# Patient Record
Sex: Female | Born: 1970 | Race: White | Hispanic: No | Marital: Married | Smoking: Never smoker
Health system: Southern US, Community
[De-identification: ages and names within clinical notes are randomized; demographics above are authoritative.]

## PROBLEM LIST (undated history)

## (undated) DIAGNOSIS — R011 Cardiac murmur, unspecified: Secondary | ICD-10-CM

## (undated) DIAGNOSIS — F32A Depression, unspecified: Secondary | ICD-10-CM

## (undated) DIAGNOSIS — C801 Malignant (primary) neoplasm, unspecified: Secondary | ICD-10-CM

## (undated) DIAGNOSIS — F329 Major depressive disorder, single episode, unspecified: Secondary | ICD-10-CM

## (undated) DIAGNOSIS — E079 Disorder of thyroid, unspecified: Secondary | ICD-10-CM

## (undated) DIAGNOSIS — M1990 Unspecified osteoarthritis, unspecified site: Secondary | ICD-10-CM

## (undated) HISTORY — DX: Disorder of thyroid, unspecified: E07.9

## (undated) HISTORY — DX: Depression, unspecified: F32.A

## (undated) HISTORY — DX: Unspecified osteoarthritis, unspecified site: M19.90

## (undated) HISTORY — DX: Cardiac murmur, unspecified: R01.1

## (undated) HISTORY — DX: Malignant (primary) neoplasm, unspecified: C80.1

## (undated) HISTORY — PX: TONSILLECTOMY AND ADENOIDECTOMY: SHX28

## (undated) HISTORY — DX: Major depressive disorder, single episode, unspecified: F32.9

---
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
## 2012-03-05 ENCOUNTER — Telehealth: Payer: Self-pay | Admitting: Endocrinology

## 2012-03-05 NOTE — Telephone Encounter (Signed)
    [phone] 

## 2012-03-06 ENCOUNTER — Telehealth: Payer: Self-pay | Admitting: Endocrinology

## 2012-03-06 NOTE — Telephone Encounter (Signed)
Please note policy

## 2012-03-06 NOTE — Telephone Encounter (Signed)
The patient called and is hoping to be a new pt of Dr.Ellison to follow her diabetes.  She does not have a PCP and does not have a referral.  I explained the office policy, however, she is in hopes an exception can be made.  Please let me know how to respond to her request.    Thanks!

## 2012-05-19 ENCOUNTER — Encounter: Payer: Self-pay | Admitting: Endocrinology

## 2012-05-19 ENCOUNTER — Ambulatory Visit (INDEPENDENT_AMBULATORY_CARE_PROVIDER_SITE_OTHER): Payer: PRIVATE HEALTH INSURANCE | Admitting: Endocrinology

## 2012-05-19 ENCOUNTER — Telehealth: Payer: Self-pay | Admitting: *Deleted

## 2012-05-19 VITALS — BP 138/72 | HR 72 | Temp 98.5°F | Wt 175.0 lb

## 2012-05-19 DIAGNOSIS — E109 Type 1 diabetes mellitus without complications: Secondary | ICD-10-CM

## 2012-05-19 DIAGNOSIS — E039 Hypothyroidism, unspecified: Secondary | ICD-10-CM

## 2012-05-19 DIAGNOSIS — E041 Nontoxic single thyroid nodule: Secondary | ICD-10-CM

## 2012-05-19 DIAGNOSIS — M069 Rheumatoid arthritis, unspecified: Secondary | ICD-10-CM

## 2012-05-19 NOTE — Telephone Encounter (Signed)
AUTHORIZATION FOR RELEASE OF INFORMATION SENT TO DR. MICHAEL POLISKY OFFICE AND TO DR. Mosie Lukes OFFICE

## 2012-05-19 NOTE — Progress Notes (Signed)
Subjective:    Patient ID: Kimberly Finley, female    DOB: Nov 18, 1970, 41 y.o.   MRN: [id number]  HPI pt states 29 years h/o dm.  she is unaware of any chronic complications.  she has been on insulin since dx.  pt says her diet is good, but and exercise is not.   She has an animas insulin pump, but does not want a continuous glucose monitor.  She averages a total of approx 35 units per day, sice she has been on the prednisone.  no cbg record, but states cbg's vary from 200-300's.  There is no trend throughout the day.   She reports a few mos of moderate arthralgias, worst at the hands, but no assoc numbness. Past Medical History  Diagnosis Date  . Arthritis   . Cancer   . Depression   . Thyroid disease   . Heart murmur     Past Surgical History  Procedure Date  . Tonsillectomy and adenoidectomy   . Breast lumpectomy 1976    History   Social History  . Marital Status: Married    Spouse Name: N/A    Number of Children: N/A  . Years of Education: N/A   Occupational History  . Not on file.   Social History Main Topics  . Smoking status: Never Smoker   . Smokeless tobacco: Not on file  . Alcohol Use: Yes  . Drug Use:   . Sexually Active:    Other Topics Concern  . Not on file   Social History Narrative  . No narrative on file    Current Outpatient Prescriptions on File Prior to Visit  Medication Sig Dispense Refill  . Insulin Lispro, Human, (HUMALOG Jerome) Inject into the skin.      Marland Kitchen levothyroxine (SYNTHROID, LEVOTHROID) 150 MCG tablet Take 150 mcg by mouth daily.        Not on File  Family History  Problem Relation Age of Onset  . Diabetes Father   . Cancer Maternal Grandmother   . Stroke Maternal Grandmother   . Hypertension Maternal Grandmother   . Heart disease Maternal Grandmother   . Stroke Maternal Grandfather   . Hypertension Maternal Grandfather   . Heart disease Maternal Grandfather     BP 138/72  Pulse 72  Temp 98.5 F (36.9 C) (Oral)  Wt 175  lb (79.379 kg)  SpO2 99%    Review of Systems denies blurry vision, headache, chest pain, sob, n/v, urinary frequency, cramps, excessive diaphoresis, memory loss, depression, rhinorrhea, and easy bruising.  She has a few lbs of weight gain. She has reg menses.    Objective:   Physical Exam VS: see vs page GEN: no distress HEAD: head: no deformity eyes: no periorbital swelling, no proptosis external nose and ears are normal mouth: no lesion seen NECK: supple, thyroid is not enlarged, but there is a 2-3 cm right thyroid nodule CHEST WALL: no deformity LUNGS:  Clear to auscultation CV: reg rate and rhythm, no murmur ABD: abdomen is soft, nontender.  no hepatosplenomegaly.  not distended.  no hernia.   MUSCULOSKELETAL: muscle bulk and strength are grossly normal.  no obvious joint swelling.  gait is normal and steady EXTEMITIES: no deformity.  no ulcer on the feet.  feet are of normal color and temp.  no edema PULSES: dorsalis pedis intact bilat.  no carotid bruit NEURO:  cn 2-12 grossly intact.   readily moves all 4's.  sensation is intact to touch on the feet SKIN:  Normal texture and temperature.  No rash or suspicious lesion is visible.   NODES:  None palpable at the neck PSYCH: alert, oriented x3.  Does not appear anxious nor depressed.     Assessment & Plan:  DM. Based on the pattern of her cbg's, she needs some adjustment in her therapy Arthralgias.  Prednisone can exac glycemic control, but we'll work around that. Nodular goiter, apparently benign

## 2012-05-19 NOTE — Patient Instructions (Addendum)
good diet and exercise habits significanly improve the control of your diabetes.  please let me know if you wish to be referred to a dietician.  high blood sugar is very risky to your health.  you should see an eye doctor every year.  You are at higher than average risk for pneumonia and hepatitis-B.  You should be vaccinated against both.   controlling your blood pressure and cholesterol drastically reduces the damage diabetes does to your body.  this also applies to quitting smoking.  please discuss these with your doctor.  you should take an aspirin every day, unless you have been advised by a doctor not to. check your blood sugar 4 times a day--before the 3 meals, and at bedtime.  also check if you have symptoms of your blood sugar being too high or too low.  please keep a record of the readings and bring it to your next appointment here.  please call us sooner if your blood sugar goes below 70, or if you have a lot of readings over 200.  Please let me know if you want to pursue a continuous glucose monitor.   In view of your medical condition, you should avoid pregnancy until we have decided it is safe.   Please sigh release of information from drs truslow and polisky. Please continue basal rate of 0.8 unit/hr, except for units/hr.   Please increase mealtime bolus to 1 unit/9 grams carbohydrate.  continue the same correction bolus (which some people call "sensitivity," or "insulin sensitivity ratio," or just "isr").   Please let me know your pump settings when you return.  Please come back for a follow-up appointment in 3 weeks.   Cc dr Kellie Simmering.

## 2012-05-20 ENCOUNTER — Telehealth: Payer: Self-pay | Admitting: *Deleted

## 2012-05-20 NOTE — Telephone Encounter (Signed)
OFFICE NOTES FOR PATIENT FROM DR. Bryan Lemma OFFICE RECEIVED AND GIVEN TO DR. ELLISON.

## 2012-06-09 ENCOUNTER — Ambulatory Visit: Payer: PRIVATE HEALTH INSURANCE | Admitting: Endocrinology

## 2012-10-22 ENCOUNTER — Other Ambulatory Visit: Payer: Self-pay | Admitting: Endocrinology

## 2012-10-22 DIAGNOSIS — E041 Nontoxic single thyroid nodule: Secondary | ICD-10-CM

## 2012-11-16 ENCOUNTER — Other Ambulatory Visit: Payer: PRIVATE HEALTH INSURANCE

## 2013-03-18 ENCOUNTER — Other Ambulatory Visit (HOSPITAL_COMMUNITY): Payer: Self-pay | Admitting: Urology

## 2013-03-18 DIAGNOSIS — N3020 Other chronic cystitis without hematuria: Secondary | ICD-10-CM

## 2013-03-23 ENCOUNTER — Ambulatory Visit (HOSPITAL_COMMUNITY)
Admission: RE | Admit: 2013-03-23 | Discharge: 2013-03-23 | Disposition: A | Discharge status: Home / Self Care | Payer: BC Managed Care – PPO | Source: Ambulatory Visit | Attending: Urology | Admitting: Urology

## 2013-03-23 DIAGNOSIS — N3020 Other chronic cystitis without hematuria: Secondary | ICD-10-CM | POA: Insufficient documentation

## 2013-05-05 ENCOUNTER — Other Ambulatory Visit: Payer: Self-pay | Admitting: Endocrinology

## 2013-05-05 ENCOUNTER — Other Ambulatory Visit (HOSPITAL_COMMUNITY): Payer: Self-pay | Admitting: Internal Medicine

## 2013-05-05 DIAGNOSIS — Z1231 Encounter for screening mammogram for malignant neoplasm of breast: Secondary | ICD-10-CM

## 2013-05-05 DIAGNOSIS — E041 Nontoxic single thyroid nodule: Secondary | ICD-10-CM

## 2013-05-14 ENCOUNTER — Ambulatory Visit
Admission: RE | Admit: 2013-05-14 | Discharge: 2013-05-14 | Disposition: A | Discharge status: Home / Self Care | Payer: BC Managed Care – PPO | Source: Ambulatory Visit | Attending: Endocrinology | Admitting: Endocrinology

## 2013-05-14 DIAGNOSIS — E041 Nontoxic single thyroid nodule: Secondary | ICD-10-CM

## 2013-05-25 ENCOUNTER — Ambulatory Visit (HOSPITAL_COMMUNITY)
Admission: RE | Admit: 2013-05-25 | Discharge: 2013-05-25 | Disposition: A | Discharge status: Home / Self Care | Payer: BC Managed Care – PPO | Source: Ambulatory Visit | Attending: Internal Medicine | Admitting: Internal Medicine

## 2013-05-25 DIAGNOSIS — Z1231 Encounter for screening mammogram for malignant neoplasm of breast: Secondary | ICD-10-CM

## 2013-06-01 ENCOUNTER — Other Ambulatory Visit: Payer: Self-pay | Admitting: Internal Medicine

## 2013-06-01 DIAGNOSIS — E041 Nontoxic single thyroid nodule: Secondary | ICD-10-CM

## 2013-06-03 ENCOUNTER — Ambulatory Visit
Admission: RE | Admit: 2013-06-03 | Discharge: 2013-06-03 | Disposition: A | Discharge status: Home / Self Care | Payer: BC Managed Care – PPO | Source: Ambulatory Visit | Attending: Internal Medicine | Admitting: Internal Medicine

## 2013-06-03 ENCOUNTER — Other Ambulatory Visit (HOSPITAL_COMMUNITY)
Admission: RE | Admit: 2013-06-03 | Discharge: 2013-06-03 | Disposition: A | Discharge status: Home / Self Care | Payer: BC Managed Care – PPO | Source: Ambulatory Visit | Attending: Internal Medicine | Admitting: Internal Medicine

## 2013-06-03 DIAGNOSIS — E041 Nontoxic single thyroid nodule: Secondary | ICD-10-CM

## 2013-06-18 ENCOUNTER — Other Ambulatory Visit: Payer: Self-pay | Admitting: Internal Medicine

## 2013-06-18 DIAGNOSIS — R928 Other abnormal and inconclusive findings on diagnostic imaging of breast: Secondary | ICD-10-CM

## 2013-06-23 ENCOUNTER — Ambulatory Visit
Admission: RE | Admit: 2013-06-23 | Discharge: 2013-06-23 | Disposition: A | Discharge status: Home / Self Care | Payer: BC Managed Care – PPO | Source: Ambulatory Visit | Attending: Internal Medicine | Admitting: Internal Medicine

## 2013-06-23 DIAGNOSIS — R928 Other abnormal and inconclusive findings on diagnostic imaging of breast: Secondary | ICD-10-CM

## 2013-09-07 ENCOUNTER — Ambulatory Visit (INDEPENDENT_AMBULATORY_CARE_PROVIDER_SITE_OTHER): Payer: BC Managed Care – PPO | Admitting: Family Medicine

## 2013-09-07 VITALS — BP 140/70 | HR 88 | Temp 98.0°F | Resp 16 | Ht 66.5 in | Wt 181.0 lb

## 2013-09-07 DIAGNOSIS — R350 Frequency of micturition: Secondary | ICD-10-CM

## 2013-09-07 DIAGNOSIS — N390 Urinary tract infection, site not specified: Secondary | ICD-10-CM

## 2013-09-07 DIAGNOSIS — IMO0001 Reserved for inherently not codable concepts without codable children: Secondary | ICD-10-CM

## 2013-09-07 LAB — POCT UA - MICROSCOPIC ONLY
CASTS, UR, LPF, POC: NEGATIVE
Crystals, Ur, HPF, POC: NEGATIVE
Epithelial cells, urine per micros: NEGATIVE
MUCUS UA: NEGATIVE
RBC, urine, microscopic: NEGATIVE
Yeast, UA: NEGATIVE

## 2013-09-07 LAB — POCT URINALYSIS DIPSTICK
Bilirubin, UA: NEGATIVE
Glucose, UA: NEGATIVE
Ketones, UA: NEGATIVE
Nitrite, UA: NEGATIVE
PH UA: 7
Protein, UA: NEGATIVE
RBC UA: NEGATIVE
Spec Grav, UA: 1.01
UROBILINOGEN UA: 0.2

## 2013-09-07 MED ORDER — CIPROFLOXACIN HCL 500 MG PO TABS: 500.0000 mg | ORAL_TABLET | Freq: Two times a day (BID) | ORAL | Status: DC

## 2013-09-07 NOTE — Patient Instructions (Signed)
Urinary Tract Infection  Urinary tract infections (UTIs) can develop anywhere along your urinary tract. Your urinary tract is your body's drainage system for removing wastes and extra water. Your urinary tract includes two kidneys, two ureters, a bladder, and a urethra. Your kidneys are a pair of bean-shaped organs. Each kidney is about the size of your fist. They are located below your ribs, one on each side of your spine.  CAUSES  Infections are caused by microbes, which are microscopic organisms, including fungi, viruses, and bacteria. These organisms are so small that they can only be seen through a microscope. Bacteria are the microbes that most commonly cause UTIs.  SYMPTOMS   Symptoms of UTIs may vary by age and gender of the patient and by the location of the infection. Symptoms in young women typically include a frequent and intense urge to urinate and a painful, burning feeling in the bladder or urethra during urination. Older women and men are more likely to be tired, shaky, and weak and have muscle aches and abdominal pain. A fever may mean the infection is in your kidneys. Other symptoms of a kidney infection include pain in your back or sides below the ribs, nausea, and vomiting.  DIAGNOSIS  To diagnose a UTI, your caregiver will ask you about your symptoms. Your caregiver also will ask to provide a urine sample. The urine sample will be tested for bacteria and white blood cells. White blood cells are made by your body to help fight infection.  TREATMENT   Typically, UTIs can be treated with medication. Because most UTIs are caused by a bacterial infection, they usually can be treated with the use of antibiotics. The choice of antibiotic and length of treatment depend on your symptoms and the type of bacteria causing your infection.  HOME CARE INSTRUCTIONS   If you were prescribed antibiotics, take them exactly as your caregiver instructs you. Finish the medication even if you feel better after you  have only taken some of the medication.   Drink enough water and fluids to keep your urine clear or pale yellow.   Avoid caffeine, tea, and carbonated beverages. They tend to irritate your bladder.   Empty your bladder often. Avoid holding urine for long periods of time.   Empty your bladder before and after sexual intercourse.   After a bowel movement, women should cleanse from front to back. Use each tissue only once.  SEEK MEDICAL CARE IF:    You have back pain.   You develop a fever.   Your symptoms do not begin to resolve within 3 days.  SEEK IMMEDIATE MEDICAL CARE IF:    You have severe back pain or lower abdominal pain.   You develop chills.   You have nausea or vomiting.   You have continued burning or discomfort with urination.  MAKE SURE YOU:    Understand these instructions.   Will watch your condition.   Will get help right away if you are not doing well or get worse.  Document Released: 03/27/2005 Document Revised: 12/17/2011 Document Reviewed: 07/26/2011  [employer] Patient Information 2014 [employer].

## 2013-09-07 NOTE — Progress Notes (Signed)
   Subjective:    Patient ID: Kimberly Finley, female    DOB: Sep 16, 1970, 43 y.o.   MRN: [id number]  HPI  43 yo female with history of recurrent UTI's with similar complaints.  Onset yesterday of frequency, urgency and hesitancy.  Associated with mild nausea as well.  Similar to prior episodes.  Last UTI in summer.  History of bactrim resistance.  No fever or chills.  No vomiting.  Taking OTC cystex for symptomatic relief.   PPMH:  Recurrent UTIs  SH:  Non smoker, occasional alcohol    Review of Systems  Constitutional: Negative for fever and chills.  Gastrointestinal: Positive for nausea. Negative for vomiting, abdominal pain, diarrhea, constipation and abdominal distention.  Genitourinary: Positive for dysuria, urgency, frequency and difficulty urinating. Negative for flank pain, vaginal bleeding and vaginal discharge.  Musculoskeletal: Negative for back pain.  Neurological: Negative for headaches.       Objective:   Physical Exam Blood pressure 140/70, pulse 88, temperature 98 F (36.7 C), temperature source Oral, resp. rate 16, height 5' 6.5" (1.689 m), weight 181 lb (82.101 kg), last menstrual period 08/24/2013, SpO2 98.00%. Body mass index is 28.78 kg/(m^2). Well-developed, well nourished female who is awake, alert and oriented, in NAD. HEENT: [place]/AT, PERRL, EOMI.  Sclera and conjunctiva are clear. . OP is clear. Neck: supple, non-tender Heart: RRR, no murmur Lungs: normal effort, CTA Abdomen: normo-active bowel sounds, supple, non-tender, no mass or organomegaly. Back:  No CVA tenderness Extremities: no cyanosis, clubbing or edema. Skin: warm and dry without rash. Psychologic: good mood and appropriate affect, normal speech and behavior.  Results for orders placed in visit on 09/07/13  POCT UA - MICROSCOPIC ONLY      Result Value Ref Range   WBC, Ur, HPF, POC 4-6     RBC, urine, microscopic negative     Bacteria, U Microscopic 1+     Mucus, UA negative     Epithelial  cells, urine per micros negative     Crystals, Ur, HPF, POC negative     Casts, Ur, LPF, POC negative     Yeast, UA negative    POCT URINALYSIS DIPSTICK      Result Value Ref Range   Color, UA light yellow     Clarity, UA clear     Glucose, UA neg     Bilirubin, UA neg     Ketones, UA neg     Spec Grav, UA 1.010     Blood, UA neg     pH, UA 7.0     Protein, UA neg     Urobilinogen, UA 0.2     Nitrite, UA neg     Leukocytes, UA large (3+)       Assessment & Plan:  UTI, will treat with cipro secondary to prior bactrim resistance, culture sent in case treatment failure.

## 2013-09-08 NOTE — Progress Notes (Signed)
Note reviewed, and agree with documentation and plan.  

## 2013-09-09 LAB — URINE CULTURE: Colony Count: >=100000

## 2014-05-13 ENCOUNTER — Other Ambulatory Visit (HOSPITAL_COMMUNITY): Payer: Self-pay | Admitting: Internal Medicine

## 2014-05-13 DIAGNOSIS — Z1231 Encounter for screening mammogram for malignant neoplasm of breast: Secondary | ICD-10-CM

## 2014-06-09 ENCOUNTER — Other Ambulatory Visit: Payer: Self-pay | Admitting: Endocrinology

## 2014-06-09 DIAGNOSIS — E041 Nontoxic single thyroid nodule: Secondary | ICD-10-CM

## 2014-06-28 ENCOUNTER — Ambulatory Visit (HOSPITAL_COMMUNITY)
Admission: RE | Admit: 2014-06-28 | Discharge: 2014-06-28 | Disposition: A | Discharge status: Home / Self Care | Payer: BC Managed Care – PPO | Source: Ambulatory Visit | Attending: Internal Medicine | Admitting: Internal Medicine

## 2014-06-28 ENCOUNTER — Ambulatory Visit
Admission: RE | Admit: 2014-06-28 | Discharge: 2014-06-28 | Disposition: A | Discharge status: Home / Self Care | Payer: BC Managed Care – PPO | Source: Ambulatory Visit | Attending: Endocrinology | Admitting: Endocrinology

## 2014-06-28 DIAGNOSIS — E041 Nontoxic single thyroid nodule: Secondary | ICD-10-CM

## 2014-06-28 DIAGNOSIS — Z1231 Encounter for screening mammogram for malignant neoplasm of breast: Secondary | ICD-10-CM | POA: Diagnosis present

## 2014-07-13 ENCOUNTER — Ambulatory Visit (INDEPENDENT_AMBULATORY_CARE_PROVIDER_SITE_OTHER): Payer: BLUE CROSS/BLUE SHIELD | Admitting: Urgent Care

## 2014-07-13 VITALS — BP 142/78 | HR 79 | Temp 98.7°F | Resp 16 | Ht 67.0 in | Wt 180.4 lb

## 2014-07-13 DIAGNOSIS — R300 Dysuria: Secondary | ICD-10-CM

## 2014-07-13 DIAGNOSIS — N390 Urinary tract infection, site not specified: Secondary | ICD-10-CM

## 2014-07-13 LAB — POCT UA - MICROSCOPIC ONLY
CASTS, UR, LPF, POC: NEGATIVE
CRYSTALS, UR, HPF, POC: NEGATIVE
Mucus, UA: NEGATIVE
YEAST UA: NEGATIVE

## 2014-07-13 LAB — POCT URINALYSIS DIPSTICK
Bilirubin, UA: NEGATIVE
Blood, UA: NEGATIVE
Glucose, UA: 500
NITRITE UA: NEGATIVE
PROTEIN UA: NEGATIVE
Spec Grav, UA: 1.025
Urobilinogen, UA: 0.2
pH, UA: 7

## 2014-07-13 MED ORDER — CIPROFLOXACIN HCL 500 MG PO TABS: 500.0000 mg | ORAL_TABLET | Freq: Two times a day (BID) | ORAL | Status: AC

## 2014-07-13 NOTE — Progress Notes (Signed)
MRN: [id number] DOB: April 03, 1971  Subjective:   Kimberly Finley is a 44 y.o. female with pmh of DM Type 1 presenting for urinary frequency, urgency, nausea. Takes over-the-counter Cystex for frequent urinary infections, last seen at [facility] 08/2013, for same complaint, treated with antibiotics. She states that these are similar symptoms to the past UTI infections. Denies fevers, chills, abdominal pain, hematuria, flank pain, pelvic pain, vaginal discharge, rashes. Of note, she saw urologist ~2 years ago, no findings of vesicoureteral reflux, advised further diagnostic studies but patient declined. Denies smoking, social alcohol use. Denies any other aggravating or relieving factors, no other questions or concerns.  Kimberly Finley has a current medication list which includes the following prescription(s): hydroxychloroquine, levothyroxine, and insulin lispro (human).  She is allergic to sulfamethoxazole.  Kimberly Finley  has a past medical history of Arthritis; Cancer; Depression; Thyroid disease; and Heart murmur. Also  has past surgical history that includes Tonsillectomy and adenoidectomy and Breast lumpectomy (1976).  ROS As in subjective.  Objective:   Vitals: BP 142/78 mmHg  Pulse 79  Temp(Src) 98.7 F (37.1 C) (Oral)  Resp 16  Ht 5\' 7"  (1.702 m)  Wt 180 lb 6.4 oz (81.829 kg)  BMI 28.25 kg/m2  SpO2 99%  LMP 06/21/2014 (Approximate)  Physical Exam  Constitutional: She is oriented to person, place, and time and well-developed, well-nourished, and in no distress.  Cardiovascular: Normal rate, regular rhythm, normal heart sounds and intact distal pulses.  Exam reveals no gallop and no friction rub.   No murmur heard. Pulmonary/Chest: Effort normal and breath sounds normal. No respiratory distress. She has no wheezes. She has no rales. She exhibits no tenderness.  Abdominal: Soft. Bowel sounds are normal. She exhibits no distension and no mass. There is no tenderness.    Neurological: She is alert and oriented to person, place, and time.  Skin: Skin is warm and dry. No rash noted. No erythema.  Psychiatric: Mood and affect normal.   Results for orders placed or performed in visit on 07/13/14 (from the past 24 hour(s))  POCT UA - Microscopic Only     Status: None   Collection Time: 07/13/14  5:38 PM  Result Value Ref Range   WBC, Ur, HPF, POC 15-20    RBC, urine, microscopic 0-2    Bacteria, U Microscopic trace    Mucus, UA neg    Epithelial cells, urine per micros 1-3    Crystals, Ur, HPF, POC neg    Casts, Ur, LPF, POC neg    Yeast, UA neg   POCT urinalysis dipstick     Status: None   Collection Time: 07/13/14  5:38 PM  Result Value Ref Range   Color, UA yellow    Clarity, UA hazy    Glucose, UA 500    Bilirubin, UA neg    Ketones, UA trace    Spec Grav, UA 1.025    Blood, UA neg    pH, UA 7.0    Protein, UA neg    Urobilinogen, UA 0.2    Nitrite, UA neg    Leukocytes, UA Trace    Assessment and Plan :   1. Dysuria 2. Urinary tract infection without hematuria, site unspecified - UA suggestive of bacterial UTI, start Ciprofloxacin x7 days - Urine culture pending - Return to clinic if symptoms worsen, fail to resolve or as needed - Advised to follow up with endocrinologist regarding glucosuria and diabetes management  Kimberly Eagles, PA-C [facility] [phone] 07/13/2014 5:46 PM

## 2014-07-15 LAB — URINE CULTURE
Colony Count: NO GROWTH
Organism ID, Bacteria: NO GROWTH

## 2014-07-18 ENCOUNTER — Encounter: Payer: Self-pay | Admitting: Urgent Care

## 2014-11-15 ENCOUNTER — Other Ambulatory Visit: Payer: Self-pay | Admitting: Obstetrics & Gynecology

## 2014-11-16 LAB — CYTOLOGY - PAP

## 2015-02-27 ENCOUNTER — Other Ambulatory Visit: Payer: Self-pay | Admitting: Endocrinology

## 2015-02-27 DIAGNOSIS — E041 Nontoxic single thyroid nodule: Secondary | ICD-10-CM

## 2015-08-09 ENCOUNTER — Other Ambulatory Visit: Payer: Self-pay

## 2016-07-10 DIAGNOSIS — Z79899 Other long term (current) drug therapy: Secondary | ICD-10-CM | POA: Diagnosis not present

## 2016-08-15 DIAGNOSIS — E1065 Type 1 diabetes mellitus with hyperglycemia: Secondary | ICD-10-CM | POA: Diagnosis not present

## 2016-10-01 DIAGNOSIS — E108 Type 1 diabetes mellitus with unspecified complications: Secondary | ICD-10-CM | POA: Diagnosis not present

## 2016-10-01 DIAGNOSIS — M0570 Rheumatoid arthritis with rheumatoid factor of unspecified site without organ or systems involvement: Secondary | ICD-10-CM | POA: Diagnosis not present

## 2016-10-01 DIAGNOSIS — M79642 Pain in left hand: Secondary | ICD-10-CM | POA: Diagnosis not present

## 2016-10-11 DIAGNOSIS — E1065 Type 1 diabetes mellitus with hyperglycemia: Secondary | ICD-10-CM | POA: Diagnosis not present

## 2016-11-06 DIAGNOSIS — J019 Acute sinusitis, unspecified: Secondary | ICD-10-CM | POA: Diagnosis not present

## 2016-11-14 ENCOUNTER — Other Ambulatory Visit: Payer: Self-pay | Admitting: Endocrinology

## 2016-11-14 DIAGNOSIS — E1065 Type 1 diabetes mellitus with hyperglycemia: Secondary | ICD-10-CM | POA: Diagnosis not present

## 2016-11-14 DIAGNOSIS — E041 Nontoxic single thyroid nodule: Secondary | ICD-10-CM | POA: Diagnosis not present

## 2016-11-14 DIAGNOSIS — E039 Hypothyroidism, unspecified: Secondary | ICD-10-CM | POA: Diagnosis not present

## 2016-11-21 IMAGING — MG MM DIGITAL SCREENING BILAT
4 series · 4 of 4 positions shown · non-contrast
Comparison: Previous exam(s).

CLINICAL DATA: Screening.

EXAM:
DIGITAL SCREENING BILATERAL MAMMOGRAM WITH CAD

[R MLO]
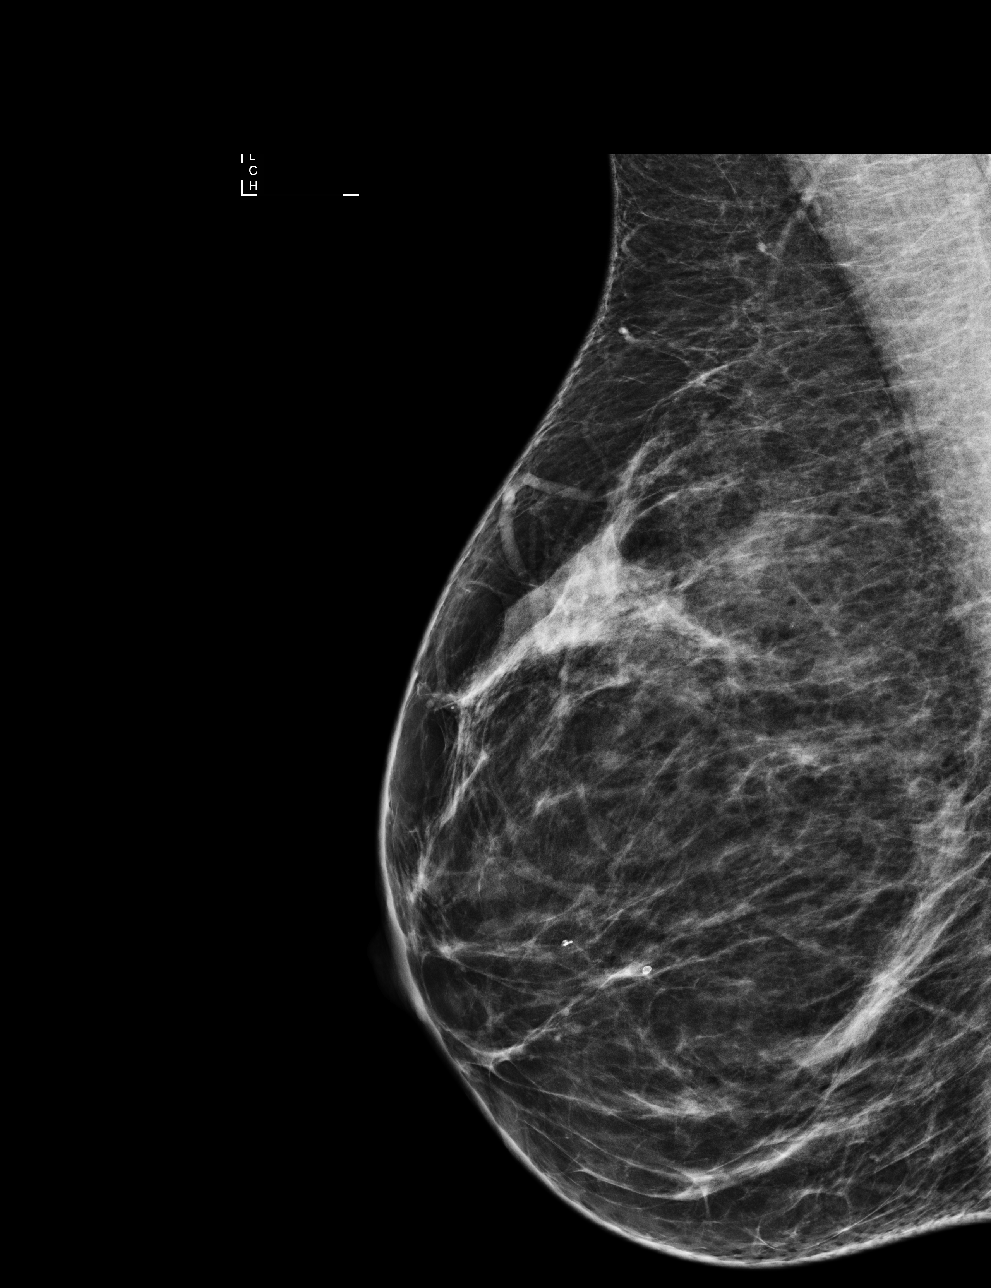

[L MLO]
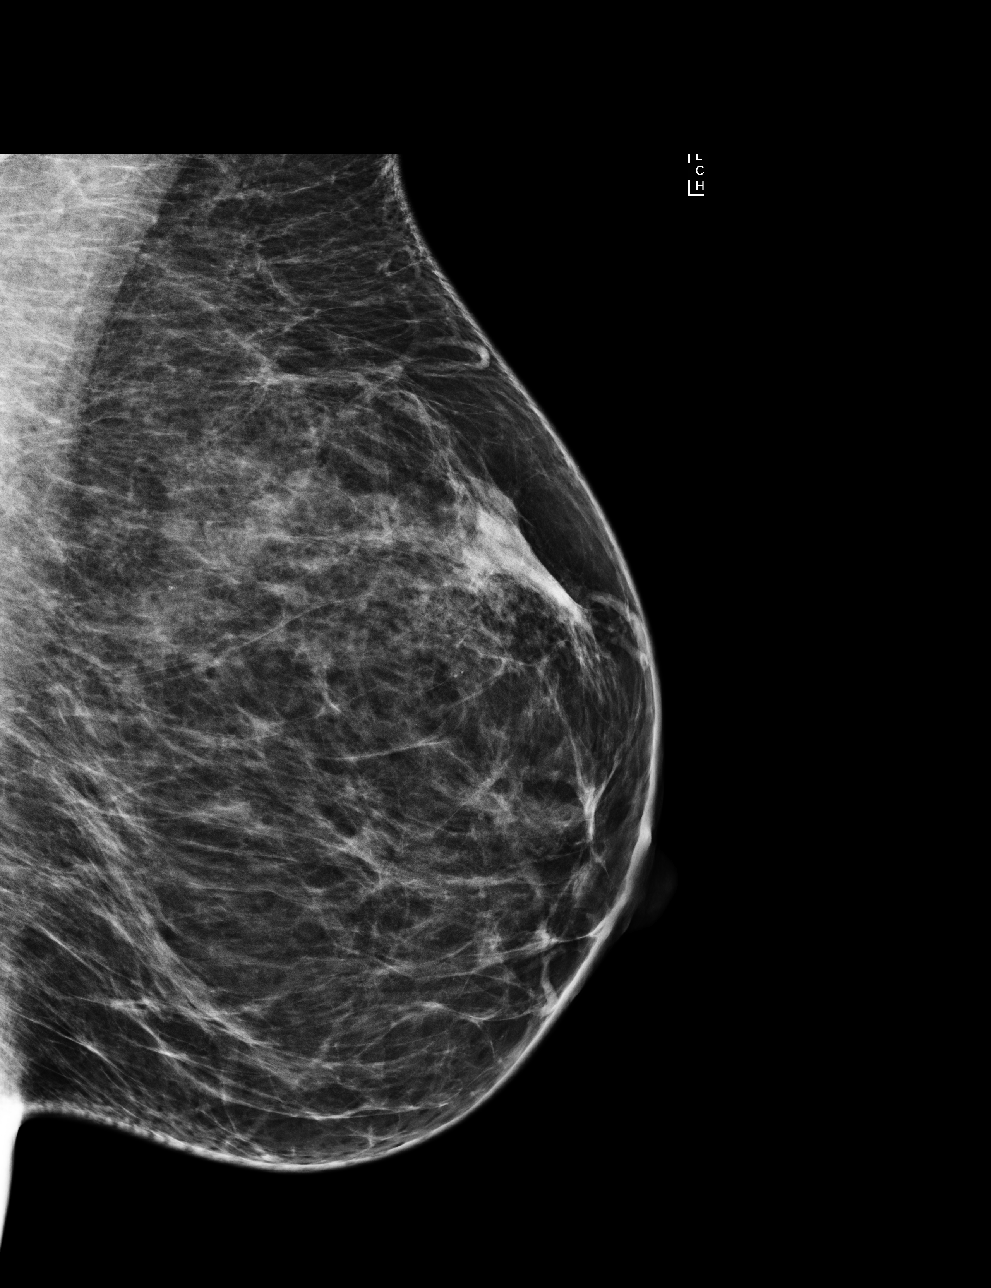

[R CC]
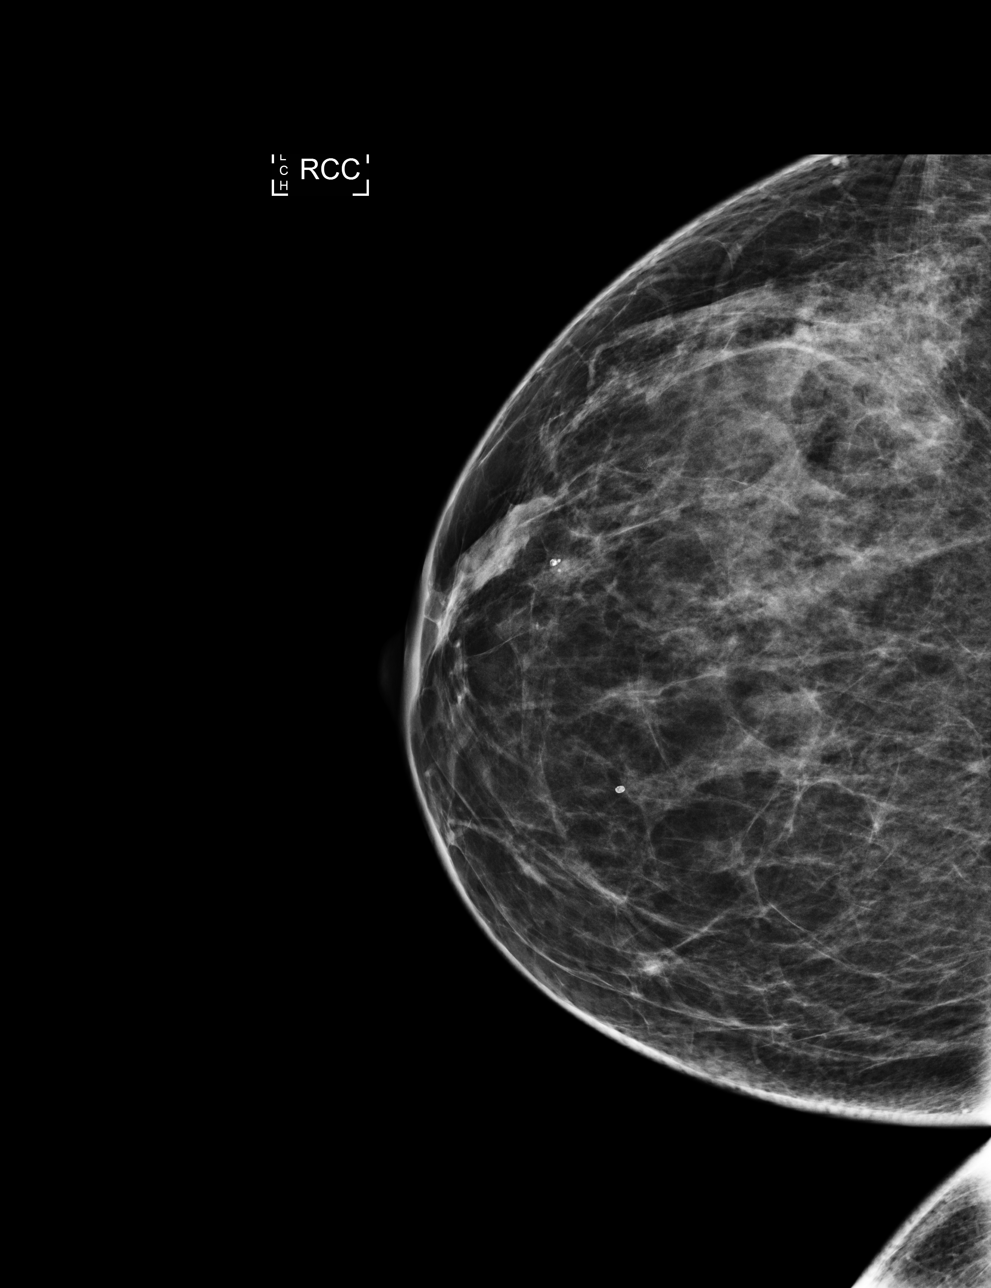

[L CC]
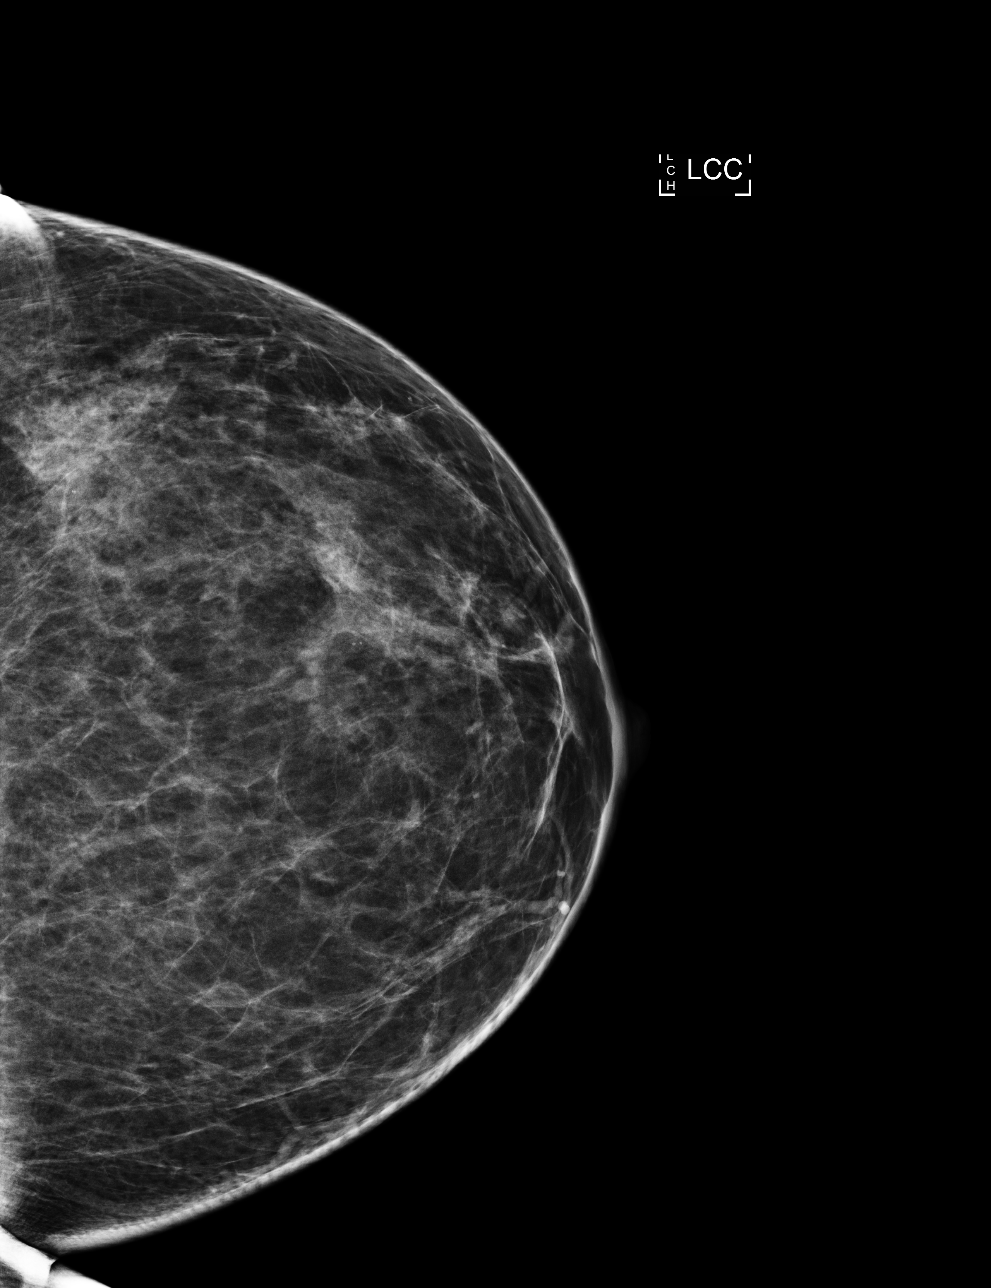

[4 of 4 positions shown; findings below may reference images not displayed]

ACR Breast Density Category b: There are scattered areas of
fibroglandular density.
FINDINGS: There are no findings suspicious for malignancy. Images were
processed with CAD.
IMPRESSION: No mammographic evidence of malignancy. A result letter of this
screening mammogram will be mailed directly to the patient.

RECOMMENDATION:
Screening mammogram in one year. (Code:AS-G-LCT)

BI-RADS CATEGORY  1: Negative.

## 2016-12-02 ENCOUNTER — Ambulatory Visit
Admission: RE | Admit: 2016-12-02 | Discharge: 2016-12-02 | Disposition: A | Discharge status: Home / Self Care | Payer: 59 | Source: Ambulatory Visit | Attending: Endocrinology | Admitting: Endocrinology

## 2016-12-02 DIAGNOSIS — E041 Nontoxic single thyroid nodule: Secondary | ICD-10-CM

## 2016-12-02 DIAGNOSIS — Z79899 Other long term (current) drug therapy: Secondary | ICD-10-CM | POA: Diagnosis not present

## 2016-12-02 DIAGNOSIS — M0579 Rheumatoid arthritis with rheumatoid factor of multiple sites without organ or systems involvement: Secondary | ICD-10-CM | POA: Diagnosis not present

## 2016-12-10 DIAGNOSIS — E1065 Type 1 diabetes mellitus with hyperglycemia: Secondary | ICD-10-CM | POA: Diagnosis not present

## 2016-12-23 DIAGNOSIS — Z01419 Encounter for gynecological examination (general) (routine) without abnormal findings: Secondary | ICD-10-CM | POA: Diagnosis not present

## 2016-12-23 DIAGNOSIS — Z124 Encounter for screening for malignant neoplasm of cervix: Secondary | ICD-10-CM | POA: Diagnosis not present

## 2016-12-27 DIAGNOSIS — E1065 Type 1 diabetes mellitus with hyperglycemia: Secondary | ICD-10-CM | POA: Diagnosis not present

## 2017-02-10 DIAGNOSIS — E1065 Type 1 diabetes mellitus with hyperglycemia: Secondary | ICD-10-CM | POA: Diagnosis not present

## 2017-03-12 DIAGNOSIS — E1065 Type 1 diabetes mellitus with hyperglycemia: Secondary | ICD-10-CM | POA: Diagnosis not present

## 2017-03-18 DIAGNOSIS — H43821 Vitreomacular adhesion, right eye: Secondary | ICD-10-CM | POA: Diagnosis not present

## 2017-03-18 DIAGNOSIS — Z79899 Other long term (current) drug therapy: Secondary | ICD-10-CM | POA: Diagnosis not present

## 2017-03-18 DIAGNOSIS — E103393 Type 1 diabetes mellitus with moderate nonproliferative diabetic retinopathy without macular edema, bilateral: Secondary | ICD-10-CM | POA: Diagnosis not present

## 2017-05-12 DIAGNOSIS — E559 Vitamin D deficiency, unspecified: Secondary | ICD-10-CM | POA: Diagnosis not present

## 2017-05-12 DIAGNOSIS — E1065 Type 1 diabetes mellitus with hyperglycemia: Secondary | ICD-10-CM | POA: Diagnosis not present

## 2017-05-12 DIAGNOSIS — R5383 Other fatigue: Secondary | ICD-10-CM | POA: Diagnosis not present

## 2017-05-12 DIAGNOSIS — E7800 Pure hypercholesterolemia, unspecified: Secondary | ICD-10-CM | POA: Diagnosis not present

## 2017-05-12 DIAGNOSIS — E039 Hypothyroidism, unspecified: Secondary | ICD-10-CM | POA: Diagnosis not present

## 2017-05-19 DIAGNOSIS — E039 Hypothyroidism, unspecified: Secondary | ICD-10-CM | POA: Diagnosis not present

## 2017-05-19 DIAGNOSIS — Z23 Encounter for immunization: Secondary | ICD-10-CM | POA: Diagnosis not present

## 2017-05-19 DIAGNOSIS — E1065 Type 1 diabetes mellitus with hyperglycemia: Secondary | ICD-10-CM | POA: Diagnosis not present

## 2017-05-19 DIAGNOSIS — E041 Nontoxic single thyroid nodule: Secondary | ICD-10-CM | POA: Diagnosis not present

## 2017-05-20 DIAGNOSIS — E1065 Type 1 diabetes mellitus with hyperglycemia: Secondary | ICD-10-CM | POA: Diagnosis not present

## 2017-06-03 DIAGNOSIS — Z79899 Other long term (current) drug therapy: Secondary | ICD-10-CM | POA: Diagnosis not present

## 2017-06-03 DIAGNOSIS — M0579 Rheumatoid arthritis with rheumatoid factor of multiple sites without organ or systems involvement: Secondary | ICD-10-CM | POA: Diagnosis not present

## 2017-07-02 DIAGNOSIS — M5137 Other intervertebral disc degeneration, lumbosacral region: Secondary | ICD-10-CM | POA: Diagnosis not present

## 2017-07-02 DIAGNOSIS — M9903 Segmental and somatic dysfunction of lumbar region: Secondary | ICD-10-CM | POA: Diagnosis not present

## 2017-07-02 DIAGNOSIS — M5417 Radiculopathy, lumbosacral region: Secondary | ICD-10-CM | POA: Diagnosis not present

## 2017-07-10 DIAGNOSIS — M5417 Radiculopathy, lumbosacral region: Secondary | ICD-10-CM | POA: Diagnosis not present

## 2017-07-10 DIAGNOSIS — M5137 Other intervertebral disc degeneration, lumbosacral region: Secondary | ICD-10-CM | POA: Diagnosis not present

## 2017-07-10 DIAGNOSIS — M9903 Segmental and somatic dysfunction of lumbar region: Secondary | ICD-10-CM | POA: Diagnosis not present

## 2017-07-14 DIAGNOSIS — E1065 Type 1 diabetes mellitus with hyperglycemia: Secondary | ICD-10-CM | POA: Diagnosis not present

## 2017-07-15 DIAGNOSIS — M5137 Other intervertebral disc degeneration, lumbosacral region: Secondary | ICD-10-CM | POA: Diagnosis not present

## 2017-07-15 DIAGNOSIS — M9903 Segmental and somatic dysfunction of lumbar region: Secondary | ICD-10-CM | POA: Diagnosis not present

## 2017-07-15 DIAGNOSIS — M5417 Radiculopathy, lumbosacral region: Secondary | ICD-10-CM | POA: Diagnosis not present

## 2017-07-29 DIAGNOSIS — E039 Hypothyroidism, unspecified: Secondary | ICD-10-CM | POA: Diagnosis not present

## 2017-07-29 DIAGNOSIS — E041 Nontoxic single thyroid nodule: Secondary | ICD-10-CM | POA: Diagnosis not present

## 2017-07-29 DIAGNOSIS — E1065 Type 1 diabetes mellitus with hyperglycemia: Secondary | ICD-10-CM | POA: Diagnosis not present

## 2017-08-12 DIAGNOSIS — R490 Dysphonia: Secondary | ICD-10-CM | POA: Diagnosis not present

## 2017-08-21 DIAGNOSIS — E1065 Type 1 diabetes mellitus with hyperglycemia: Secondary | ICD-10-CM | POA: Diagnosis not present

## 2017-10-06 ENCOUNTER — Ambulatory Visit: Payer: 59 | Admitting: Allergy & Immunology

## 2017-11-05 DIAGNOSIS — E103393 Type 1 diabetes mellitus with moderate nonproliferative diabetic retinopathy without macular edema, bilateral: Secondary | ICD-10-CM | POA: Diagnosis not present

## 2017-11-05 DIAGNOSIS — Z79899 Other long term (current) drug therapy: Secondary | ICD-10-CM | POA: Diagnosis not present

## 2017-11-05 DIAGNOSIS — M069 Rheumatoid arthritis, unspecified: Secondary | ICD-10-CM | POA: Diagnosis not present

## 2017-11-14 DIAGNOSIS — E7800 Pure hypercholesterolemia, unspecified: Secondary | ICD-10-CM | POA: Diagnosis not present

## 2017-11-14 DIAGNOSIS — E1065 Type 1 diabetes mellitus with hyperglycemia: Secondary | ICD-10-CM | POA: Diagnosis not present

## 2017-11-14 DIAGNOSIS — E039 Hypothyroidism, unspecified: Secondary | ICD-10-CM | POA: Diagnosis not present

## 2017-11-14 DIAGNOSIS — Z79899 Other long term (current) drug therapy: Secondary | ICD-10-CM | POA: Diagnosis not present

## 2017-11-17 DIAGNOSIS — E041 Nontoxic single thyroid nodule: Secondary | ICD-10-CM | POA: Diagnosis not present

## 2017-11-17 DIAGNOSIS — W5501XA Bitten by cat, initial encounter: Secondary | ICD-10-CM | POA: Diagnosis not present

## 2017-11-17 DIAGNOSIS — E039 Hypothyroidism, unspecified: Secondary | ICD-10-CM | POA: Diagnosis not present

## 2017-11-17 DIAGNOSIS — L03114 Cellulitis of left upper limb: Secondary | ICD-10-CM | POA: Diagnosis not present

## 2017-11-17 DIAGNOSIS — E1065 Type 1 diabetes mellitus with hyperglycemia: Secondary | ICD-10-CM | POA: Diagnosis not present

## 2017-11-18 DIAGNOSIS — E1065 Type 1 diabetes mellitus with hyperglycemia: Secondary | ICD-10-CM | POA: Diagnosis not present

## 2017-11-27 DIAGNOSIS — H43821 Vitreomacular adhesion, right eye: Secondary | ICD-10-CM | POA: Diagnosis not present

## 2017-11-27 DIAGNOSIS — E103393 Type 1 diabetes mellitus with moderate nonproliferative diabetic retinopathy without macular edema, bilateral: Secondary | ICD-10-CM | POA: Diagnosis not present

## 2017-11-27 DIAGNOSIS — Z79899 Other long term (current) drug therapy: Secondary | ICD-10-CM | POA: Diagnosis not present

## 2017-12-02 DIAGNOSIS — M0579 Rheumatoid arthritis with rheumatoid factor of multiple sites without organ or systems involvement: Secondary | ICD-10-CM | POA: Diagnosis not present

## 2017-12-02 DIAGNOSIS — Z79899 Other long term (current) drug therapy: Secondary | ICD-10-CM | POA: Diagnosis not present

## 2017-12-09 DIAGNOSIS — L720 Epidermal cyst: Secondary | ICD-10-CM | POA: Diagnosis not present

## 2017-12-09 DIAGNOSIS — C44319 Basal cell carcinoma of skin of other parts of face: Secondary | ICD-10-CM | POA: Diagnosis not present

## 2018-01-19 DIAGNOSIS — Z01419 Encounter for gynecological examination (general) (routine) without abnormal findings: Secondary | ICD-10-CM | POA: Diagnosis not present

## 2018-01-19 DIAGNOSIS — Z1231 Encounter for screening mammogram for malignant neoplasm of breast: Secondary | ICD-10-CM | POA: Diagnosis not present

## 2018-01-19 DIAGNOSIS — Z124 Encounter for screening for malignant neoplasm of cervix: Secondary | ICD-10-CM | POA: Diagnosis not present

## 2018-01-19 DIAGNOSIS — R87610 Atypical squamous cells of undetermined significance on cytologic smear of cervix (ASC-US): Secondary | ICD-10-CM | POA: Diagnosis not present

## 2018-01-28 DIAGNOSIS — Z0001 Encounter for general adult medical examination with abnormal findings: Secondary | ICD-10-CM | POA: Diagnosis not present

## 2018-01-28 DIAGNOSIS — Z0000 Encounter for general adult medical examination without abnormal findings: Secondary | ICD-10-CM | POA: Diagnosis not present

## 2018-01-28 DIAGNOSIS — Z23 Encounter for immunization: Secondary | ICD-10-CM | POA: Diagnosis not present

## 2018-02-20 DIAGNOSIS — E1065 Type 1 diabetes mellitus with hyperglycemia: Secondary | ICD-10-CM | POA: Diagnosis not present

## 2018-02-24 DIAGNOSIS — L821 Other seborrheic keratosis: Secondary | ICD-10-CM | POA: Diagnosis not present

## 2018-03-20 DIAGNOSIS — Z23 Encounter for immunization: Secondary | ICD-10-CM | POA: Diagnosis not present

## 2018-03-20 DIAGNOSIS — E039 Hypothyroidism, unspecified: Secondary | ICD-10-CM | POA: Diagnosis not present

## 2018-03-20 DIAGNOSIS — Z9641 Presence of insulin pump (external) (internal): Secondary | ICD-10-CM | POA: Diagnosis not present

## 2018-03-20 DIAGNOSIS — E1065 Type 1 diabetes mellitus with hyperglycemia: Secondary | ICD-10-CM | POA: Diagnosis not present

## 2018-04-28 DIAGNOSIS — M9903 Segmental and somatic dysfunction of lumbar region: Secondary | ICD-10-CM | POA: Diagnosis not present

## 2018-04-28 DIAGNOSIS — M5417 Radiculopathy, lumbosacral region: Secondary | ICD-10-CM | POA: Diagnosis not present

## 2018-04-28 DIAGNOSIS — M5137 Other intervertebral disc degeneration, lumbosacral region: Secondary | ICD-10-CM | POA: Diagnosis not present

## 2018-05-05 DIAGNOSIS — M5137 Other intervertebral disc degeneration, lumbosacral region: Secondary | ICD-10-CM | POA: Diagnosis not present

## 2018-05-05 DIAGNOSIS — M9903 Segmental and somatic dysfunction of lumbar region: Secondary | ICD-10-CM | POA: Diagnosis not present

## 2018-05-05 DIAGNOSIS — M5417 Radiculopathy, lumbosacral region: Secondary | ICD-10-CM | POA: Diagnosis not present

## 2018-05-07 DIAGNOSIS — M5137 Other intervertebral disc degeneration, lumbosacral region: Secondary | ICD-10-CM | POA: Diagnosis not present

## 2018-05-07 DIAGNOSIS — M9903 Segmental and somatic dysfunction of lumbar region: Secondary | ICD-10-CM | POA: Diagnosis not present

## 2018-05-07 DIAGNOSIS — M5417 Radiculopathy, lumbosacral region: Secondary | ICD-10-CM | POA: Diagnosis not present

## 2018-05-13 DIAGNOSIS — M9903 Segmental and somatic dysfunction of lumbar region: Secondary | ICD-10-CM | POA: Diagnosis not present

## 2018-05-13 DIAGNOSIS — M5137 Other intervertebral disc degeneration, lumbosacral region: Secondary | ICD-10-CM | POA: Diagnosis not present

## 2018-05-13 DIAGNOSIS — M5417 Radiculopathy, lumbosacral region: Secondary | ICD-10-CM | POA: Diagnosis not present

## 2018-05-15 DIAGNOSIS — M9903 Segmental and somatic dysfunction of lumbar region: Secondary | ICD-10-CM | POA: Diagnosis not present

## 2018-05-15 DIAGNOSIS — M5417 Radiculopathy, lumbosacral region: Secondary | ICD-10-CM | POA: Diagnosis not present

## 2018-05-15 DIAGNOSIS — M5137 Other intervertebral disc degeneration, lumbosacral region: Secondary | ICD-10-CM | POA: Diagnosis not present

## 2018-05-27 DIAGNOSIS — E1065 Type 1 diabetes mellitus with hyperglycemia: Secondary | ICD-10-CM | POA: Diagnosis not present

## 2018-07-06 DIAGNOSIS — Z79899 Other long term (current) drug therapy: Secondary | ICD-10-CM | POA: Diagnosis not present

## 2018-07-06 DIAGNOSIS — M0579 Rheumatoid arthritis with rheumatoid factor of multiple sites without organ or systems involvement: Secondary | ICD-10-CM | POA: Diagnosis not present

## 2018-07-06 DIAGNOSIS — E119 Type 2 diabetes mellitus without complications: Secondary | ICD-10-CM | POA: Diagnosis not present

## 2018-08-21 DIAGNOSIS — E1065 Type 1 diabetes mellitus with hyperglycemia: Secondary | ICD-10-CM | POA: Diagnosis not present

## 2018-09-02 DIAGNOSIS — N951 Menopausal and female climacteric states: Secondary | ICD-10-CM | POA: Diagnosis not present

## 2018-09-18 DIAGNOSIS — E039 Hypothyroidism, unspecified: Secondary | ICD-10-CM | POA: Diagnosis not present

## 2018-09-18 DIAGNOSIS — E1065 Type 1 diabetes mellitus with hyperglycemia: Secondary | ICD-10-CM | POA: Diagnosis not present

## 2018-09-18 DIAGNOSIS — E7800 Pure hypercholesterolemia, unspecified: Secondary | ICD-10-CM | POA: Diagnosis not present

## 2018-09-25 DIAGNOSIS — E039 Hypothyroidism, unspecified: Secondary | ICD-10-CM | POA: Diagnosis not present

## 2018-09-25 DIAGNOSIS — E041 Nontoxic single thyroid nodule: Secondary | ICD-10-CM | POA: Diagnosis not present

## 2018-09-25 DIAGNOSIS — E1065 Type 1 diabetes mellitus with hyperglycemia: Secondary | ICD-10-CM | POA: Diagnosis not present

## 2018-11-24 DIAGNOSIS — E109 Type 1 diabetes mellitus without complications: Secondary | ICD-10-CM | POA: Diagnosis not present

## 2018-11-24 DIAGNOSIS — Z794 Long term (current) use of insulin: Secondary | ICD-10-CM | POA: Diagnosis not present

## 2018-11-26 DIAGNOSIS — E109 Type 1 diabetes mellitus without complications: Secondary | ICD-10-CM | POA: Diagnosis not present

## 2018-11-26 DIAGNOSIS — Z794 Long term (current) use of insulin: Secondary | ICD-10-CM | POA: Diagnosis not present

## 2019-01-15 DIAGNOSIS — Z794 Long term (current) use of insulin: Secondary | ICD-10-CM | POA: Diagnosis not present

## 2019-01-15 DIAGNOSIS — E109 Type 1 diabetes mellitus without complications: Secondary | ICD-10-CM | POA: Diagnosis not present

## 2019-02-03 DIAGNOSIS — Z0001 Encounter for general adult medical examination with abnormal findings: Secondary | ICD-10-CM | POA: Diagnosis not present

## 2019-02-03 DIAGNOSIS — Z23 Encounter for immunization: Secondary | ICD-10-CM | POA: Diagnosis not present

## 2019-02-03 DIAGNOSIS — E7800 Pure hypercholesterolemia, unspecified: Secondary | ICD-10-CM | POA: Diagnosis not present

## 2019-02-03 DIAGNOSIS — E1065 Type 1 diabetes mellitus with hyperglycemia: Secondary | ICD-10-CM | POA: Diagnosis not present

## 2019-02-03 DIAGNOSIS — E559 Vitamin D deficiency, unspecified: Secondary | ICD-10-CM | POA: Diagnosis not present

## 2019-02-16 DIAGNOSIS — E1065 Type 1 diabetes mellitus with hyperglycemia: Secondary | ICD-10-CM | POA: Diagnosis not present

## 2019-02-16 DIAGNOSIS — E039 Hypothyroidism, unspecified: Secondary | ICD-10-CM | POA: Diagnosis not present

## 2019-02-16 DIAGNOSIS — Z1231 Encounter for screening mammogram for malignant neoplasm of breast: Secondary | ICD-10-CM | POA: Diagnosis not present

## 2019-02-16 DIAGNOSIS — Z01419 Encounter for gynecological examination (general) (routine) without abnormal findings: Secondary | ICD-10-CM | POA: Diagnosis not present

## 2019-02-16 DIAGNOSIS — E109 Type 1 diabetes mellitus without complications: Secondary | ICD-10-CM | POA: Diagnosis not present

## 2019-02-16 DIAGNOSIS — E041 Nontoxic single thyroid nodule: Secondary | ICD-10-CM | POA: Diagnosis not present

## 2019-02-16 DIAGNOSIS — E7800 Pure hypercholesterolemia, unspecified: Secondary | ICD-10-CM | POA: Diagnosis not present

## 2019-02-18 DIAGNOSIS — K5904 Chronic idiopathic constipation: Secondary | ICD-10-CM | POA: Diagnosis not present

## 2019-02-18 DIAGNOSIS — Z1211 Encounter for screening for malignant neoplasm of colon: Secondary | ICD-10-CM | POA: Diagnosis not present

## 2019-02-25 DIAGNOSIS — Z794 Long term (current) use of insulin: Secondary | ICD-10-CM | POA: Diagnosis not present

## 2019-02-25 DIAGNOSIS — E109 Type 1 diabetes mellitus without complications: Secondary | ICD-10-CM | POA: Diagnosis not present

## 2019-03-02 DIAGNOSIS — E103393 Type 1 diabetes mellitus with moderate nonproliferative diabetic retinopathy without macular edema, bilateral: Secondary | ICD-10-CM | POA: Diagnosis not present

## 2019-03-02 DIAGNOSIS — H43821 Vitreomacular adhesion, right eye: Secondary | ICD-10-CM | POA: Diagnosis not present

## 2019-03-02 DIAGNOSIS — Z79899 Other long term (current) drug therapy: Secondary | ICD-10-CM | POA: Diagnosis not present

## 2019-03-02 DIAGNOSIS — M069 Rheumatoid arthritis, unspecified: Secondary | ICD-10-CM | POA: Diagnosis not present

## 2019-03-31 DIAGNOSIS — E109 Type 1 diabetes mellitus without complications: Secondary | ICD-10-CM | POA: Diagnosis not present

## 2019-03-31 DIAGNOSIS — Z1211 Encounter for screening for malignant neoplasm of colon: Secondary | ICD-10-CM | POA: Diagnosis not present

## 2019-03-31 DIAGNOSIS — D127 Benign neoplasm of rectosigmoid junction: Secondary | ICD-10-CM | POA: Diagnosis not present

## 2019-03-31 DIAGNOSIS — Z794 Long term (current) use of insulin: Secondary | ICD-10-CM | POA: Diagnosis not present

## 2019-03-31 DIAGNOSIS — K635 Polyp of colon: Secondary | ICD-10-CM | POA: Diagnosis not present

## 2019-04-06 DIAGNOSIS — M0579 Rheumatoid arthritis with rheumatoid factor of multiple sites without organ or systems involvement: Secondary | ICD-10-CM | POA: Diagnosis not present

## 2019-04-06 DIAGNOSIS — E119 Type 2 diabetes mellitus without complications: Secondary | ICD-10-CM | POA: Diagnosis not present

## 2019-04-06 DIAGNOSIS — Z79899 Other long term (current) drug therapy: Secondary | ICD-10-CM | POA: Diagnosis not present

## 2019-04-08 DIAGNOSIS — Z13820 Encounter for screening for osteoporosis: Secondary | ICD-10-CM | POA: Diagnosis not present

## 2019-04-08 DIAGNOSIS — Z23 Encounter for immunization: Secondary | ICD-10-CM | POA: Diagnosis not present

## 2019-05-13 DIAGNOSIS — Z794 Long term (current) use of insulin: Secondary | ICD-10-CM | POA: Diagnosis not present

## 2019-05-13 DIAGNOSIS — E109 Type 1 diabetes mellitus without complications: Secondary | ICD-10-CM | POA: Diagnosis not present

## 2019-06-08 ENCOUNTER — Other Ambulatory Visit: Payer: Self-pay | Admitting: Endocrinology

## 2019-06-08 DIAGNOSIS — E041 Nontoxic single thyroid nodule: Secondary | ICD-10-CM

## 2019-06-14 DIAGNOSIS — E7800 Pure hypercholesterolemia, unspecified: Secondary | ICD-10-CM | POA: Diagnosis not present

## 2019-06-14 DIAGNOSIS — N950 Postmenopausal bleeding: Secondary | ICD-10-CM | POA: Diagnosis not present

## 2019-06-14 DIAGNOSIS — E1065 Type 1 diabetes mellitus with hyperglycemia: Secondary | ICD-10-CM | POA: Diagnosis not present

## 2019-06-14 DIAGNOSIS — Z6829 Body mass index (BMI) 29.0-29.9, adult: Secondary | ICD-10-CM | POA: Diagnosis not present

## 2019-06-14 DIAGNOSIS — E041 Nontoxic single thyroid nodule: Secondary | ICD-10-CM | POA: Diagnosis not present

## 2019-06-14 DIAGNOSIS — Z23 Encounter for immunization: Secondary | ICD-10-CM | POA: Diagnosis not present

## 2019-06-14 DIAGNOSIS — E039 Hypothyroidism, unspecified: Secondary | ICD-10-CM | POA: Diagnosis not present

## 2019-06-18 ENCOUNTER — Ambulatory Visit
Admission: RE | Admit: 2019-06-18 | Discharge: 2019-06-18 | Disposition: A | Discharge status: Home / Self Care | Payer: BC Managed Care – PPO | Source: Ambulatory Visit | Attending: Endocrinology | Admitting: Endocrinology

## 2019-06-18 DIAGNOSIS — E041 Nontoxic single thyroid nodule: Secondary | ICD-10-CM | POA: Diagnosis not present

## 2020-03-01 ENCOUNTER — Encounter (INDEPENDENT_AMBULATORY_CARE_PROVIDER_SITE_OTHER): Payer: BC Managed Care – PPO | Admitting: Ophthalmology

## 2021-09-14 IMAGING — US US THYROID
1 series · 13 of 25 positions shown · non-contrast
Comparison: 12/02/2016, 06/28/2014

CLINICAL DATA: 48-year-old female with a history of thyroid nodules

EXAM:
THYROID ULTRASOUND
TECHNIQUE: Ultrasound examination of the thyroid gland and adjacent soft
tissues was performed.

[Series 1: us thyroid · 0.07mm/px · 13 of 46 slices shown]
[im 1/46]
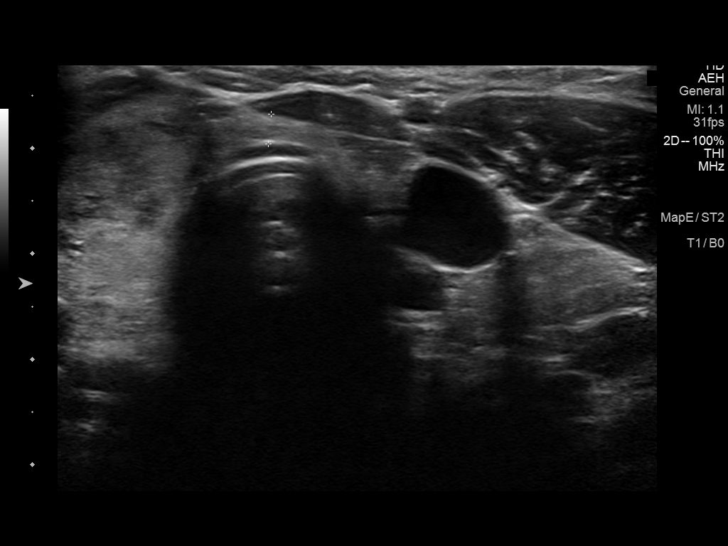
[im 4/46]
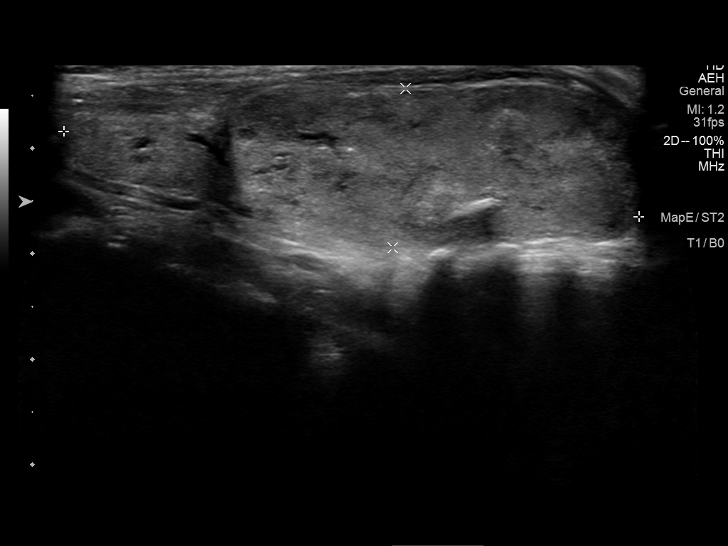
[im 8/46]
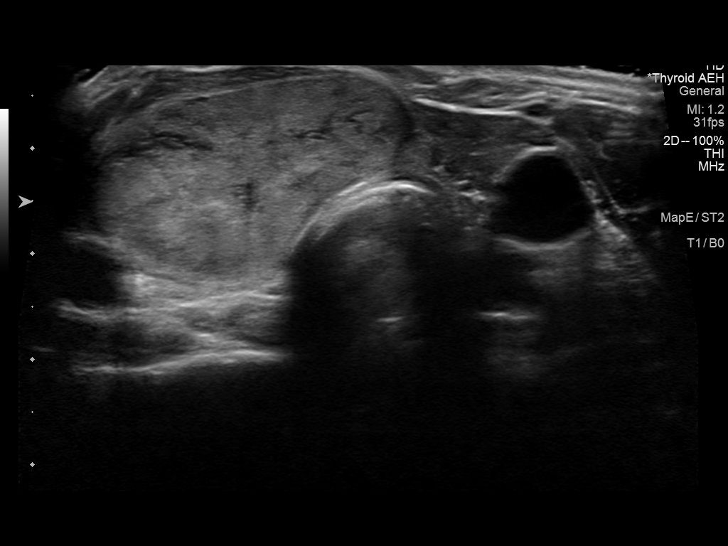
[im 12/46]
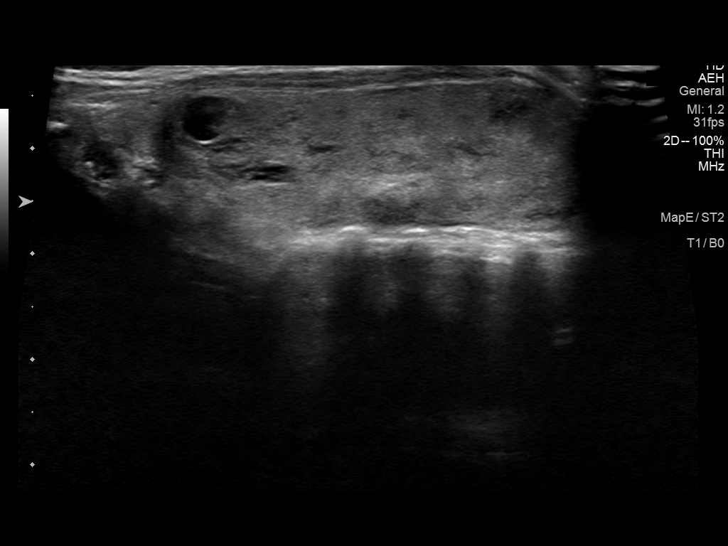
[im 16/46]
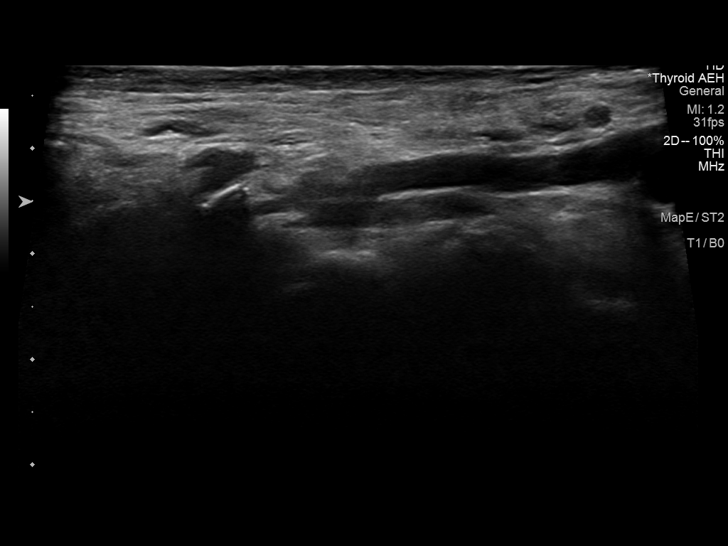
[im 19/46]
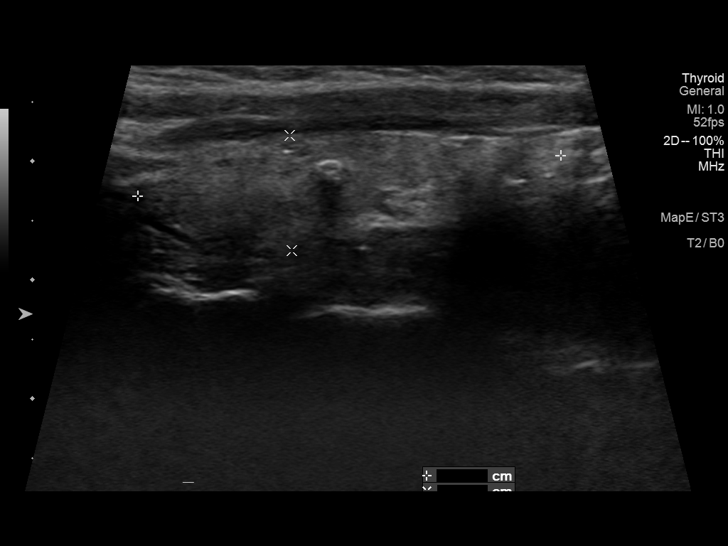
[im 23/46]
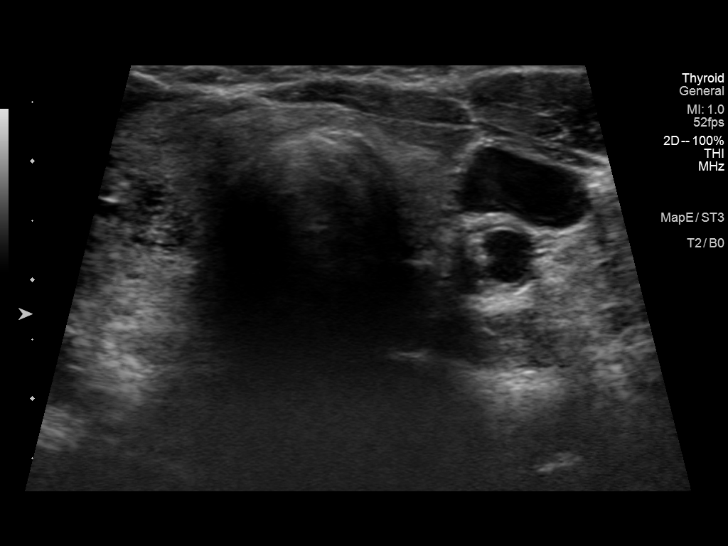
[im 27/46]
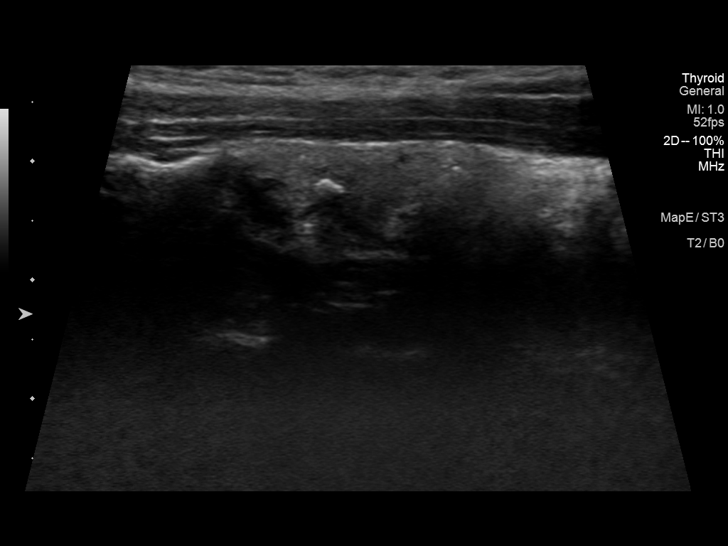
[im 31/46]
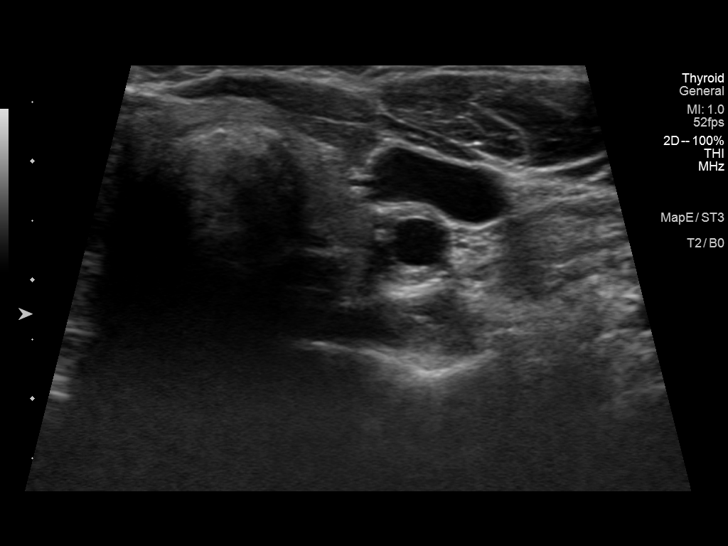
[im 34/46]
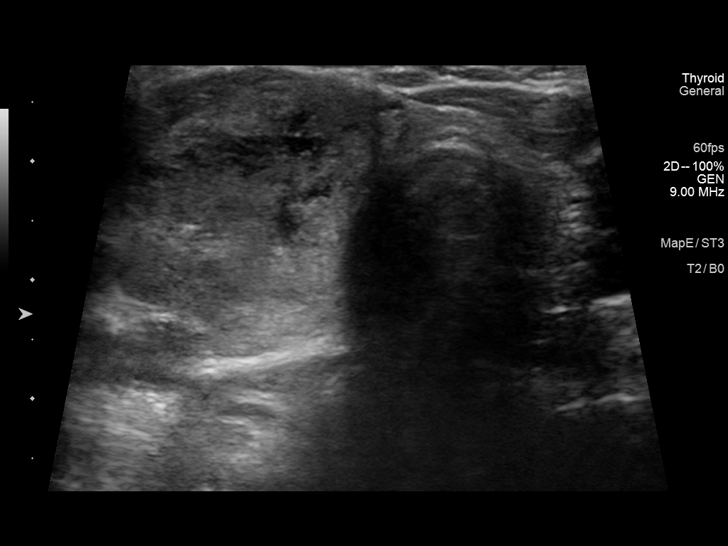
[im 38/46]
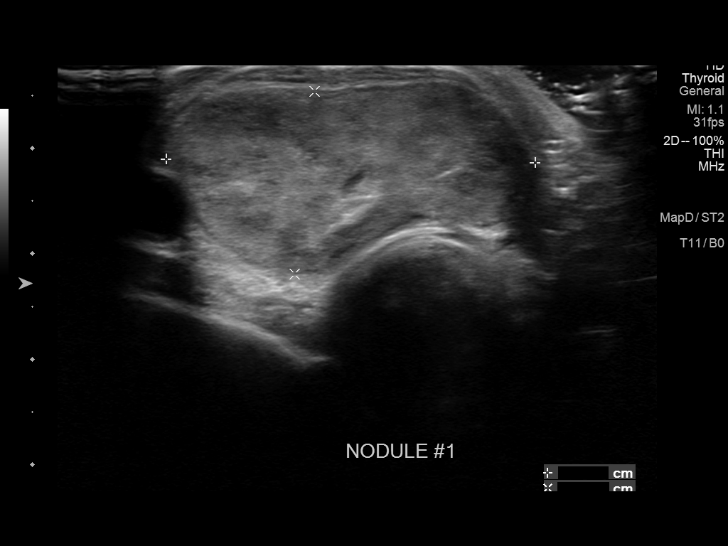
[im 42/46]
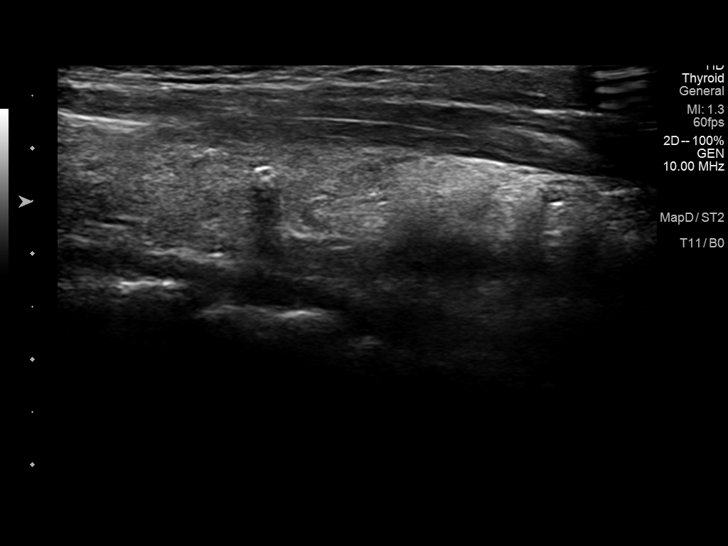
[im 46/46]
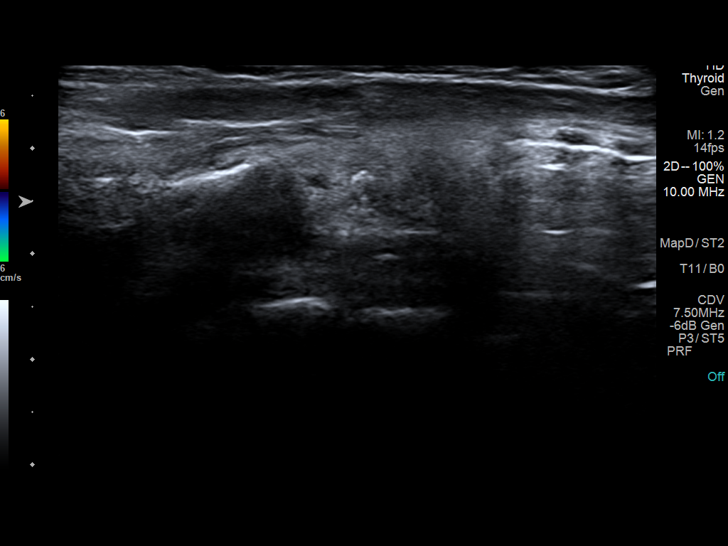

[13 of 25 positions shown; findings below may reference images not displayed]

FINDINGS: Parenchymal Echotexture: Moderately heterogenous

Isthmus: 0.3 cm

Right lobe: 5.5 cm x 1.5 cm x 2.3 cm

Left lobe: 3.6 cm x 1.0 cm x 0.5 cm

_________________________________________________________

Estimated total number of nodules >/= 1 cm: 1

Number of spongiform nodules >/=  2 cm not described below (TR1): 0

Number of mixed cystic and solid nodules >/= 1.5 cm not described
below (TR2): 0

_________________________________________________________

Nodule labeled 1 in the right thyroid measures 3.9 cm, slightly
larger than the previous ultrasound of 6961 when the thyroid nodule
measures 3.5 cm. This nodule has been previously biopsied. Coarse
calcification present now within the mid aspect of the thyroid
nodule, TR 4.

No new nodules. Focal coarse calcifications in the left thyroid
tissue.

No adenopathy.
IMPRESSION: Ultrasound survey again demonstrates right-sided thyroid nodule
which measures 4 mm larger than the comparison ultrasound. Nodule is
TR 4 by the updated ACR criteria. If the prior biopsy performed
demonstrates benign result, no further specific follow-up would be
indicated. If the sample was nondiagnostic or equivocal, repeat
biopsy is indicated.

Recommendations follow those established by the new ACR TI-RADS
criteria ([HOSPITAL] 8292;[DATE]).
